# Patient Record
Sex: Female | Born: 1990 | Race: White | Hispanic: Yes | Marital: Single | Smoking: Never smoker
Health system: Southern US, Community
[De-identification: ages and names within clinical notes are randomized; demographics above are authoritative.]

## PROBLEM LIST (undated history)

## (undated) DIAGNOSIS — N83209 Unspecified ovarian cyst, unspecified side: Secondary | ICD-10-CM

## (undated) HISTORY — DX: Unspecified ovarian cyst, unspecified side: N83.209

---
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
## 2011-11-08 IMAGING — US US PELVIS COMPLETE MODIFY
1 series · 13 of 25 positions shown · non-contrast
Comparison: [HOSPITAL] abdominal radiographs 05/15/2009.

CLINICAL DATA: No abdominal / suprapubic pain.

TRANSABDOMINAL AND TRANSVAGINAL ULTRASOUND OF PELVIS
TECHNIQUE: Both transabdominal and transvaginal ultrasound
examinations of the pelvis were performed including evaluation of
the uterus, ovaries, adnexal regions, and pelvic cul-de-sac.

[Series 1: us pelvis complete modify · 0.28mm/px · 13 of 73 slices shown]
[im 1/73]
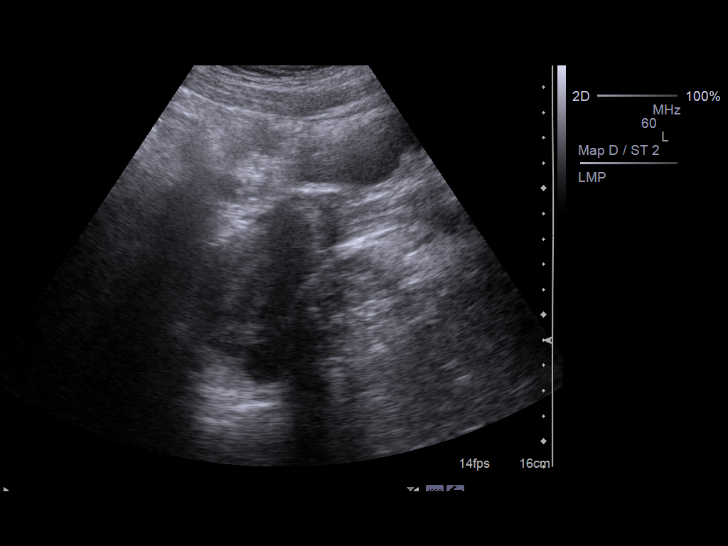
[im 7/73]
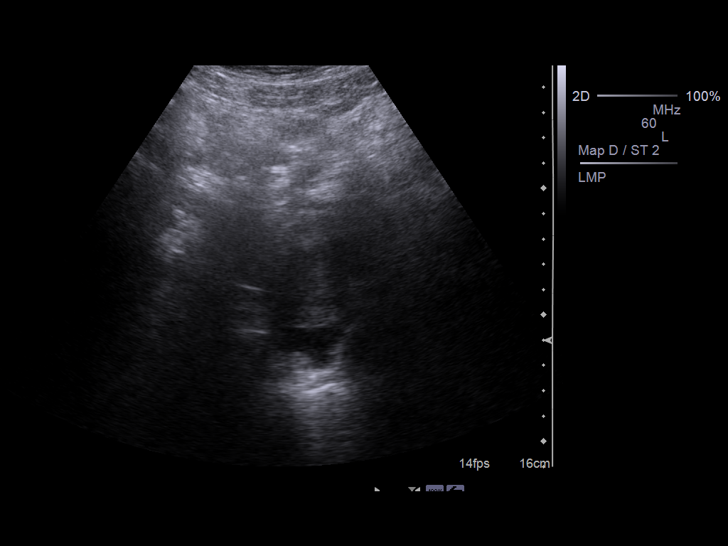
[im 13/73]
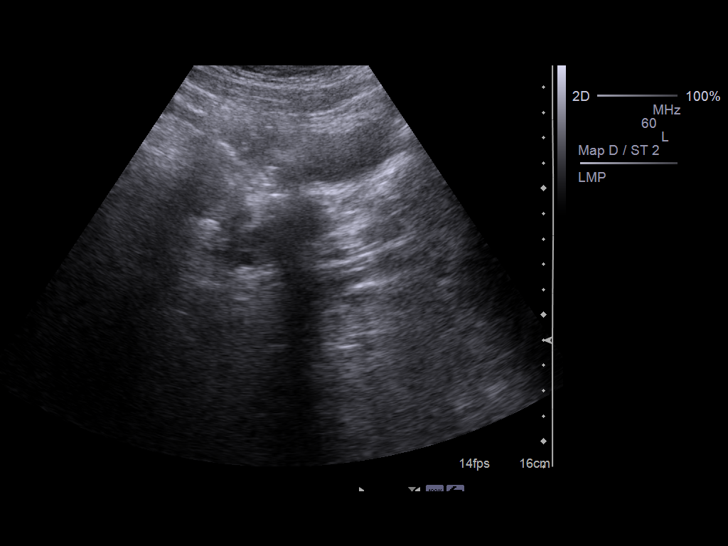
[im 19/73]
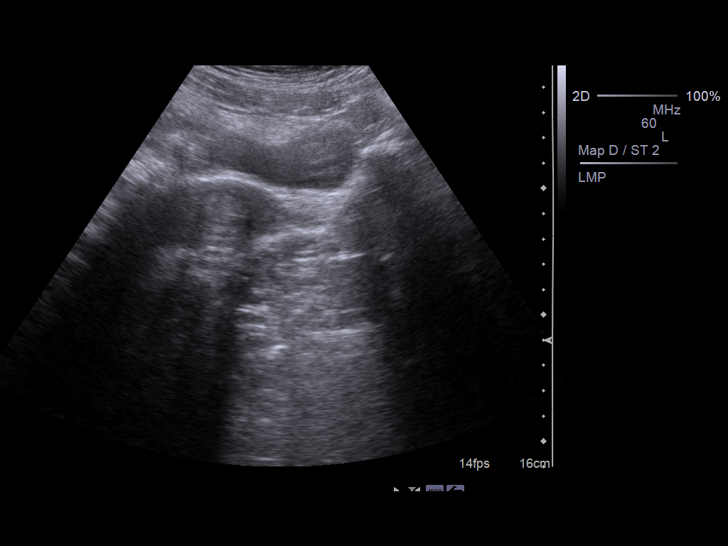
[im 25/73]
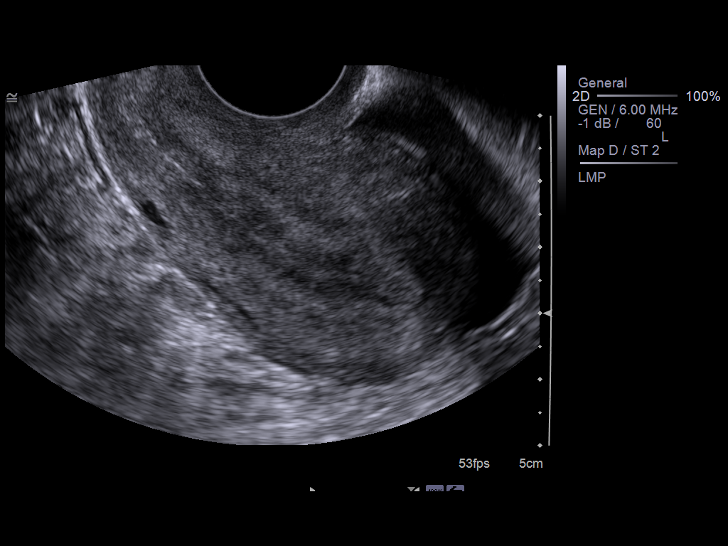
[im 31/73]
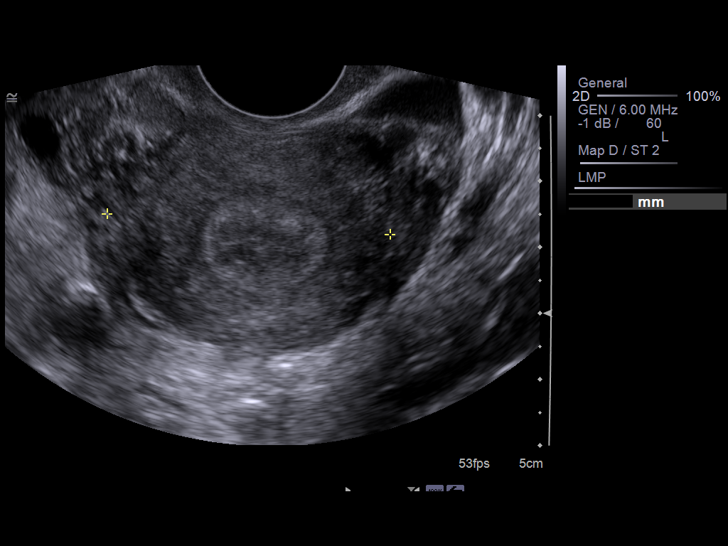
[im 37/73]
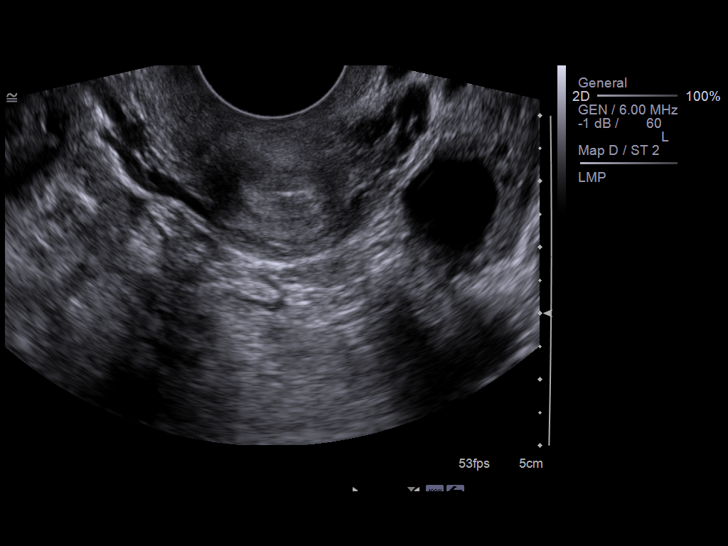
[im 43/73]
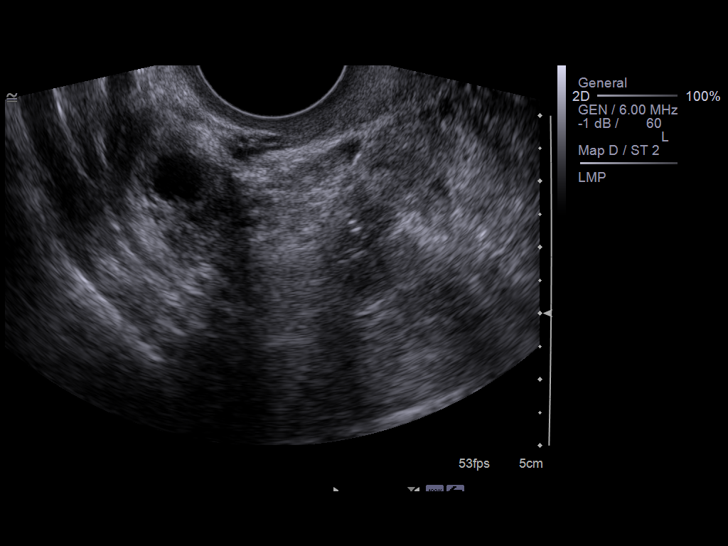
[im 49/73]
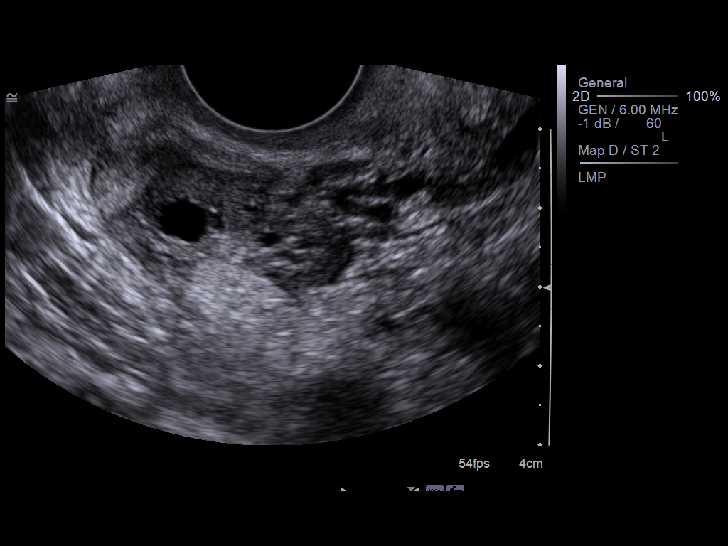
[im 55/73]
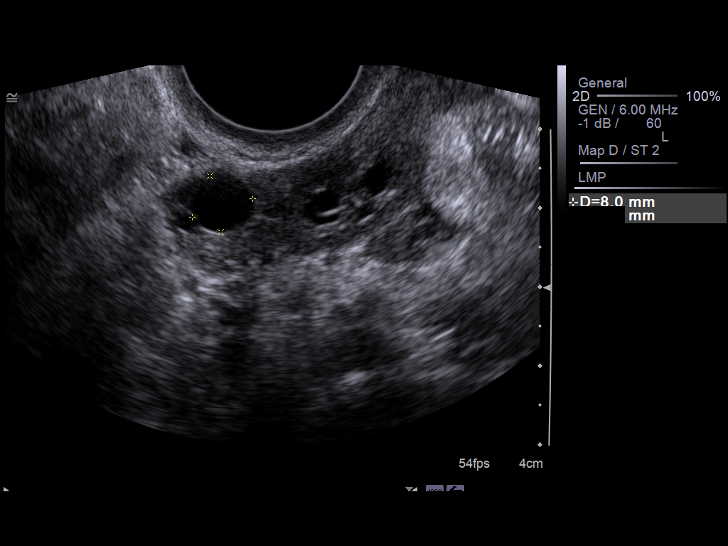
[im 61/73]
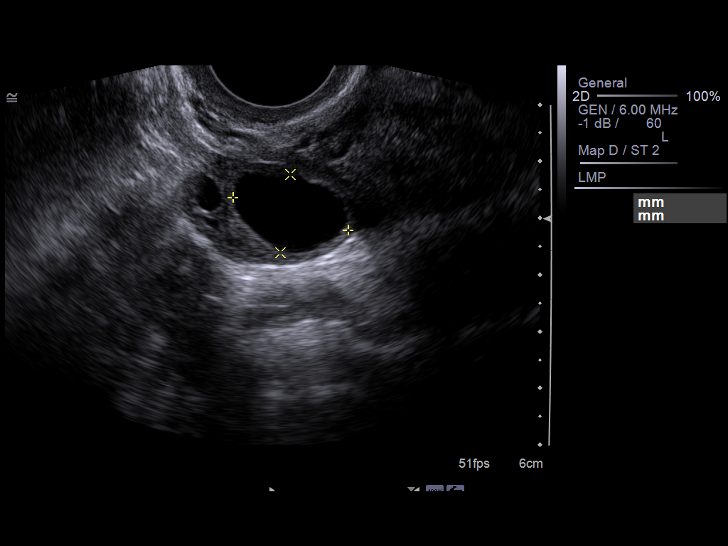
[im 67/73]
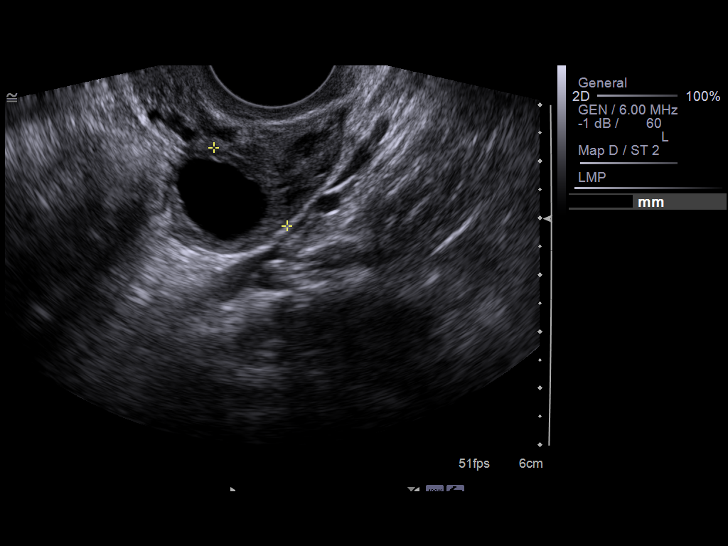
[im 73/73]
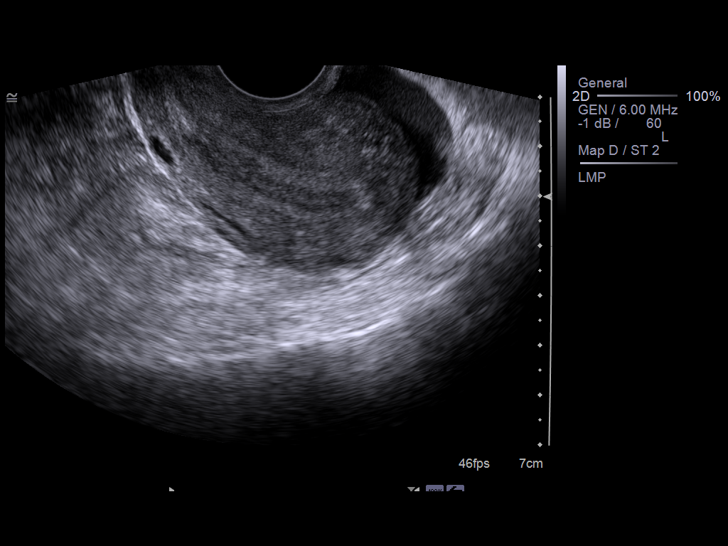

[13 of 25 positions shown; findings below may reference images not displayed]

FINDINGS: Uterus measures normal for age at 698 cm long X 0.9 cm AP X 4.3 cm
wide with no focal lesion and slight retroversion.

Endometrium appears unchanged and normal in size for age 1.2 cm
wide.

Right Ovary measures normal in size at 3.5 cm long X 1.3 cm AP X
1.6 cm wide containing a half of is (9 mm and less).

Left Ovary measures normal in size at 3.5 cm long X 2.2 cm AP X
cm wide containing simple follicular cyst measuring 2.1 cm long X
1.4 cm AP X 1.7 cm wide.

Other Findings:  Slight cul-de-sac free fluid is consistent with
ovulation and/or ruptured ovarian cyst.
IMPRESSION: 1.  Slight cul-de-sac free fluid consistent with ovulation and/or
ruptured ovarian cyst.
2.  Anatomic variant retroverted normal.
3.  Simple 2.1 cm left ovarian follicular cyst. (No further imaging
follow-up deemed necessary).
4.  Otherwise, negative.

## 2012-05-26 ENCOUNTER — Encounter: Payer: Self-pay | Admitting: Obstetrics

## 2013-05-13 ENCOUNTER — Ambulatory Visit: Payer: Self-pay | Admitting: Physician Assistant

## 2013-05-13 VITALS — BP 108/58 | HR 77 | Temp 98.8°F | Resp 18 | Ht 62.5 in | Wt 168.0 lb

## 2013-05-13 DIAGNOSIS — R110 Nausea: Secondary | ICD-10-CM

## 2013-05-13 DIAGNOSIS — Z309 Encounter for contraceptive management, unspecified: Secondary | ICD-10-CM

## 2013-05-13 LAB — POCT URINE PREGNANCY: Preg Test, Ur: NEGATIVE

## 2013-05-13 MED ORDER — NORGESTIM-ETH ESTRAD TRIPHASIC 0.18/0.215/0.25 MG-35 MCG PO TABS: 1.0000 | ORAL_TABLET | Freq: Every day | ORAL | Status: DC

## 2013-05-13 NOTE — Progress Notes (Signed)
   Subjective:    Patient ID: Melinda LeighDaisy Harper, female    DOB: 1990/01/29, 23 y.o.   MRN: [id number]  HPI 23 year old female presents for contraception management.  She has been taking Tri-Sprintec for "years" without any problems.  She has had regular periods and has no concern of pregnancy.  Is also requesting a pap test today. States she had one 4-5 years ago that was normal. Has hx of ovarian cysts but has not had any problems recently.  Also complains of slight nausea at night when she is laying in bed.  Takes her OCP's around 6:00 p.m, sometimes with food but not always.  States this has been improving and is less frequent now.  No associated vomiting or abdominal pain.  Denies vaginal discharge, abdominal pain, dysuria, urinary frequency.  She is married. 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  Patient is otherwise doing well with no other concerns today.     Review of Systems  Constitutional: Negative for fever and chills.  Gastrointestinal: Positive for nausea. Negative for vomiting, abdominal pain and diarrhea.  Genitourinary: Negative for dysuria, frequency, vaginal discharge and vaginal pain.       Objective:   Physical Exam  Constitutional: She is oriented to person, place, and time. She appears well-developed and well-nourished.  HENT:  Head: Normocephalic and atraumatic.  Right Ear: External ear normal.  Left Ear: External ear normal.  Eyes: Conjunctivae are normal.  Neck: Normal range of motion.  Cardiovascular: Normal rate, regular rhythm and normal heart sounds.   Pulmonary/Chest: Effort normal and breath sounds normal.  Abdominal: Soft. Bowel sounds are normal. There is no tenderness. There is no rebound and no guarding.  Neurological: She is alert and oriented to person, place, and time.  Psychiatric: She has a normal mood and affect. Her behavior is normal. Judgment and thought content normal.    Results for orders placed in visit on 05/13/13  POCT URINE  PREGNANCY      Result Value Ref Range   Preg Test, Ur Negative           Assessment & Plan:  Contraception management - Plan: POCT urine pregnancy  Nausea alone  Due to cost, plan to schedule [facility] - numbers and contact person provided to patient.   Refilled OCP's x 6 months. Will await pap results to refill for entire year.  Follow up here as needed.

## 2013-05-13 NOTE — Patient Instructions (Signed)
[facility] - Martie LeeSabrina [phone]

## 2013-07-29 ENCOUNTER — Ambulatory Visit (INDEPENDENT_AMBULATORY_CARE_PROVIDER_SITE_OTHER): Payer: Self-pay | Admitting: Family Medicine

## 2013-07-29 VITALS — BP 118/70 | HR 84 | Temp 98.6°F | Resp 16 | Ht 63.25 in | Wt 167.8 lb

## 2013-07-29 DIAGNOSIS — Z308 Encounter for other contraceptive management: Secondary | ICD-10-CM

## 2013-07-29 DIAGNOSIS — N3000 Acute cystitis without hematuria: Secondary | ICD-10-CM

## 2013-07-29 DIAGNOSIS — R300 Dysuria: Secondary | ICD-10-CM

## 2013-07-29 DIAGNOSIS — Z01419 Encounter for gynecological examination (general) (routine) without abnormal findings: Secondary | ICD-10-CM

## 2013-07-29 DIAGNOSIS — N898 Other specified noninflammatory disorders of vagina: Secondary | ICD-10-CM

## 2013-07-29 LAB — POCT UA - MICROSCOPIC ONLY
CASTS, UR, LPF, POC: NEGATIVE
CRYSTALS, UR, HPF, POC: NEGATIVE
MUCUS UA: NEGATIVE
YEAST UA: NEGATIVE

## 2013-07-29 LAB — POCT URINALYSIS DIPSTICK
Bilirubin, UA: NEGATIVE
GLUCOSE UA: NEGATIVE
Ketones, UA: NEGATIVE
Nitrite, UA: POSITIVE
Protein, UA: 100
Spec Grav, UA: 1.02
Urobilinogen, UA: 0.2
pH, UA: 6.5

## 2013-07-29 LAB — POCT WET PREP WITH KOH
KOH PREP POC: NEGATIVE
Trichomonas, UA: NEGATIVE
Yeast Wet Prep HPF POC: NEGATIVE

## 2013-07-29 MED ORDER — SULFAMETHOXAZOLE-TMP DS 800-160 MG PO TABS: 10.0000 | ORAL_TABLET | Freq: Two times a day (BID) | ORAL | Status: DC

## 2013-07-29 MED ORDER — NORGESTIM-ETH ESTRAD TRIPHASIC 0.18/0.215/0.25 MG-35 MCG PO TABS: 1.0000 | ORAL_TABLET | Freq: Every day | ORAL | Status: DC

## 2013-07-29 NOTE — Progress Notes (Signed)
Subjective: 23 year old lady who is here for several things. She needs a Pap smear to be given more of her birth control pills. She tried to go elsewhere but never could get him to give her an appointment. She's been having some dysuria last few days is a little bit of blood in the urine. She has had the passage of a clot of right clotted material that had a tiny bit of blood in it from her vagina. Last intercourse was over a week ago. She is married, her husband her is her only sexual partner. 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 Of back pain or fevers.  Objective: Well-developed young lady in no acute distress. Chest clear. Heart regular without murmurs. Abdomen soft and nontender. No CVA tenderness. Normal external genitalia. Vaginal mucosa unremarkable. Cervix benign. Pap was taken. There is only some mild nonspecific normal whitish discharge. Bimanual exam feels no adnexal or uterine masses. Extremities normal.   Assessment: Contraceptive management Dysuria Nonspecific vaginal discharge Urinary tract infection Pap smear   Results for orders placed in visit on 07/29/13  POCT WET PREP WITH KOH      Result Value Ref Range   Trichomonas, UA Negative     Clue Cells Wet Prep HPF POC 3-5     Epithelial Wet Prep HPF POC 6-10     Yeast Wet Prep HPF POC neg     Bacteria Wet Prep HPF POC 4+     RBC Wet Prep HPF POC 0-1     WBC Wet Prep HPF POC 2-6     KOH Prep POC Negative    POCT UA - MICROSCOPIC ONLY      Result Value Ref Range   WBC, Ur, HPF, POC 4-8     RBC, urine, microscopic tntc     Bacteria, U Microscopic 4+     Mucus, UA neg     Epithelial cells, urine per micros 0-3     Crystals, Ur, HPF, POC neg     Casts, Ur, LPF, POC neg     Yeast, UA neg    POCT URINALYSIS DIPSTICK      Result Value Ref Range   Color, UA yellow     Clarity, UA clear     Glucose, UA neg     Bilirubin, UA neg     Ketones, UA neg     Spec Grav, UA 1.020     Blood, UA moderate      pH, UA 6.5     Protein, UA 100     Urobilinogen, UA 0.2     Nitrite, UA positive     Leukocytes, UA Trace     I do not feel that the few clue cells is consistent with her having BP, so did not treat for that. Did treat for a mild urinary tract infection. Decided not to culture her urine.  Paps smear #3

## 2013-07-29 NOTE — Patient Instructions (Signed)
Drink lots of fluids.  Return if further problems with passing stuff from your vagina.  Continue current birth control pills.

## 2013-08-01 LAB — PAP IG, CT-NG, RFX HPV ASCU
CHLAMYDIA PROBE AMP: NEGATIVE
GC PROBE AMP: NEGATIVE

## 2013-12-06 ENCOUNTER — Ambulatory Visit (INDEPENDENT_AMBULATORY_CARE_PROVIDER_SITE_OTHER): Payer: Self-pay | Admitting: Internal Medicine

## 2013-12-06 VITALS — BP 118/60 | HR 69 | Temp 98.2°F | Resp 16 | Ht 62.5 in | Wt 172.2 lb

## 2013-12-06 DIAGNOSIS — R1031 Right lower quadrant pain: Secondary | ICD-10-CM

## 2013-12-06 MED ORDER — NORETHIN ACE-ETH ESTRAD-FE 1.5-30 MG-MCG PO TABS: 1.0000 | ORAL_TABLET | Freq: Every day | ORAL | Status: DC

## 2013-12-06 MED ORDER — POLYETHYLENE GLYCOL 3350 17 GM/SCOOP PO POWD: 17.0000 g | Freq: Two times a day (BID) | ORAL | Status: DC | PRN

## 2013-12-06 NOTE — Progress Notes (Signed)
   Subjective:    Patient ID: Melinda Harper, female    DOB: August 27, 1990, 23 y.o.   MRN: [id number]  HPI Complaining of right lower quadrant and mid lower quadrant discomfort intermittently since jyesterday No nausea or vomiting/no loss of appetite/no diarrhea/history of intermittent constipation for months. Most recently she has had only 2 bowel movements in the last 24 hours both producing a very small and hard stool. No bleeding. No GU symptoms. No vaginal symptoms or dyspareunia. No fever or chills.  She also recently had chest pain which was worse with deep breathing and occurred without diaphoresis palpitations nausea or shortness of breath. This extended over a few days until she discontinued her birth control pills and then the chest pain disappeared She remains asymptomatic. Last menstrual period was at the discontinuation of pills approximately 2 weeks ago. She would like another form of contraception. She was on this pill for 2 years.  Married. Healthy in general. History of ovarian cystx1  Review of Systems As noted above    Objective:   Physical Exam BP 118/60 mmHg  Pulse 69  Temp(Src) 98.2 F (36.8 C) (Oral)  Resp 16  Ht 5' 2.5" (1.588 m)  Wt 172 lb 3.2 oz (78.109 kg)  BMI 30.97 kg/m2  SpO2 100%  LMP 11/14/2013 No thyromegaly or lymphadenopathy Heart regular without murmur Lungs clear Abdomen soft nontender nondistended with only slight tenderness in the right lower quadrant and negative rebound or guarding Bowel sounds are normal         Assessment & Plan:  Impression #1 abdominal pain secondary to constipation Liquids/high-fiber diet/MiraLAX twice a day to regular stools  Impression #2 needs new contraception Start Loestrin 1.5/30 at the first day of her next period

## 2014-11-03 LAB — GLUCOSE, POCT (MANUAL RESULT ENTRY): POC Glucose: 114 mg/dl — AB (ref 70–99)

## 2014-12-06 ENCOUNTER — Ambulatory Visit (INDEPENDENT_AMBULATORY_CARE_PROVIDER_SITE_OTHER): Payer: Self-pay | Admitting: Family Medicine

## 2014-12-06 ENCOUNTER — Telehealth: Payer: Self-pay

## 2014-12-06 VITALS — BP 120/78 | HR 62 | Temp 98.1°F | Resp 16 | Ht 62.0 in | Wt 169.4 lb

## 2014-12-06 DIAGNOSIS — R079 Chest pain, unspecified: Secondary | ICD-10-CM

## 2014-12-06 DIAGNOSIS — N939 Abnormal uterine and vaginal bleeding, unspecified: Secondary | ICD-10-CM

## 2014-12-06 DIAGNOSIS — Z3041 Encounter for surveillance of contraceptive pills: Secondary | ICD-10-CM

## 2014-12-06 LAB — POCT URINE PREGNANCY: Preg Test, Ur: NEGATIVE

## 2014-12-06 MED ORDER — NORETHIN ACE-ETH ESTRAD-FE 1.5-30 MG-MCG PO TABS: 1.0000 | ORAL_TABLET | Freq: Every day | ORAL | Status: DC

## 2014-12-06 NOTE — Patient Instructions (Signed)
I refilled same contraceptive for now. Pap testing every 3 years.  Try zantac over the counter if chest pain appears to be associated with food. Return with diary of when the chest pains occur. Return to the clinic or go to the nearest emergency room if any of your symptoms worsen or new symptoms occur.  Oral Contraception Use Oral contraceptive pills (OCPs) are medicines taken to prevent pregnancy. OCPs work by preventing the ovaries from releasing eggs. The hormones in OCPs also cause the cervical mucus to thicken, preventing the sperm from entering the uterus. The hormones also cause the uterine lining to become thin, not allowing a fertilized egg to attach to the inside of the uterus. OCPs are highly effective when taken exactly as prescribed. However, OCPs do not prevent sexually transmitted diseases (STDs). Safe sex practices, such as using condoms along with an OCP, can help prevent STDs. Before taking OCPs, you may have a physical exam and Pap test. Your health care provider may also order blood tests if necessary. Your health care provider will make sure you are a good candidate for oral contraception. Discuss with your health care provider the possible side effects of the OCP you may be prescribed. When starting an OCP, it can take 2 to 3 months for the body to adjust to the changes in hormone levels in your body.  HOW TO TAKE ORAL CONTRACEPTIVE PILLS Your health care provider may advise you on how to start taking the first cycle of OCPs. Otherwise, you can:   Start on day 1 of your menstrual period. You will not need any backup contraceptive protection with this start time.   Start on the first Sunday after your menstrual period or the day you get your prescription. In these cases, you will need to use backup contraceptive protection for the first week.   Start the pill at any time of your cycle. If you take the pill within 5 days of the start of your period, you are protected against  pregnancy right away. In this case, you will not need a backup form of birth control. If you start at any other time of your menstrual cycle, you will need to use another form of birth control for 7 days. If your OCP is the type called a minipill, it will protect you from pregnancy after taking it for 2 days (48 hours). After you have started taking OCPs:   If you forget to take 1 pill, take it as soon as you remember. Take the next pill at the regular time.   If you miss 2 or more pills, call your health care provider because different pills have different instructions for missed doses. Use backup birth control until your next menstrual period starts.   If you use a 28-day pack that contains inactive pills and you miss 1 of the last 7 pills (pills with no hormones), it will not matter. Throw away the rest of the non-hormone pills and start a new pill pack.  No matter which day you start the OCP, you will always start a new pack on that same day of the week. Have an extra pack of OCPs and a backup contraceptive method available in case you miss some pills or lose your OCP pack.  HOME CARE INSTRUCTIONS   Do not smoke.   Always use a condom to protect against STDs. OCPs do not protect against STDs.   Use a calendar to mark your menstrual period days.   Read the  information and directions that came with your OCP. Talk to your health care provider if you have questions.  SEEK MEDICAL CARE IF:   You develop nausea and vomiting.   You have abnormal vaginal discharge or bleeding.   You develop a rash.   You miss your menstrual period.   You are losing your hair.   You need treatment for mood swings or depression.   You get dizzy when taking the OCP.   You develop acne from taking the OCP.   You become pregnant.  SEEK IMMEDIATE MEDICAL CARE IF:   You develop chest pain.   You develop shortness of breath.   You have an uncontrolled or severe headache.   You  develop numbness or slurred speech.   You develop visual problems.   You develop pain, redness, and swelling in the legs.    This information is not intended to replace advice given to you by your health care provider. Make sure you discuss any questions you have with your health care provider.   Document Released: 12/12/2010 Document Revised: 01/13/2014 Document Reviewed: 06/13/2012 Elsevier Interactive Patient Education 2016 Elsevier Inc.   Nonspecific Chest Pain  Chest pain can be caused by many different conditions. There is always a chance that your pain could be related to something serious, such as a heart attack or a blood clot in your lungs. Chest pain can also be caused by conditions that are not life-threatening. If you have chest pain, it is very important to follow up with your health care provider. CAUSES  Chest pain can be caused by:  Heartburn.  Pneumonia or bronchitis.  Anxiety or stress.  Inflammation around your heart (pericarditis) or lung (pleuritis or pleurisy).  A blood clot in your lung.  A collapsed lung (pneumothorax). It can develop suddenly on its own (spontaneous pneumothorax) or from trauma to the chest.  Shingles infection (varicella-zoster virus).  Heart attack.  Damage to the bones, muscles, and cartilage that make up your chest wall. This can include:  Bruised bones due to injury.  Strained muscles or cartilage due to frequent or repeated coughing or overwork.  Fracture to one or more ribs.  Sore cartilage due to inflammation (costochondritis). RISK FACTORS  Risk factors for chest pain may include:  Activities that increase your risk for trauma or injury to your chest.  Respiratory infections or conditions that cause frequent coughing.  Medical conditions or overeating that can cause heartburn.  Heart disease or family history of heart disease.  Conditions or health behaviors that increase your risk of developing a blood  clot.  Having had chicken pox (varicella zoster). SIGNS AND SYMPTOMS Chest pain can feel like:  Burning or tingling on the surface of your chest or deep in your chest.  Crushing, pressure, aching, or squeezing pain.  Dull or sharp pain that is worse when you move, cough, or take a deep breath.  Pain that is also felt in your back, neck, shoulder, or arm, or pain that spreads to any of these areas. Your chest pain may come and go, or it may stay constant. DIAGNOSIS Lab tests or other studies may be needed to find the cause of your pain. Your health care provider may have you take a test called an ambulatory ECG (electrocardiogram). An ECG records your heartbeat patterns at the time the test is performed. You may also have other tests, such as:  Transthoracic echocardiogram (TTE). During echocardiography, sound waves are used to create a picture of  all of the heart structures and to look at how blood flows through your heart.  Transesophageal echocardiogram (TEE).This is a more advanced imaging test that obtains images from inside your body. It allows your health care provider to see your heart in finer detail.  Cardiac monitoring. This allows your health care provider to monitor your heart rate and rhythm in real time.  Holter monitor. This is a portable device that records your heartbeat and can help to diagnose abnormal heartbeats. It allows your health care provider to track your heart activity for several days, if needed.  Stress tests. These can be done through exercise or by taking medicine that makes your heart beat more quickly.  Blood tests.  Imaging tests. TREATMENT  Your treatment depends on what is causing your chest pain. Treatment may include:  Medicines. These may include:  Acid blockers for heartburn.  Anti-inflammatory medicine.  Pain medicine for inflammatory conditions.  Antibiotic medicine, if an infection is present.  Medicines to dissolve blood  clots.  Medicines to treat coronary artery disease.  Supportive care for conditions that do not require medicines. This may include:  Resting.  Applying heat or cold packs to injured areas.  Limiting activities until pain decreases. HOME CARE INSTRUCTIONS  If you were prescribed an antibiotic medicine, finish it all even if you start to feel better.  Avoid any activities that bring on chest pain.  Do not use any tobacco products, including cigarettes, chewing tobacco, or electronic cigarettes. If you need help quitting, ask your health care provider.  Do not drink alcohol.  Take medicines only as directed by your health care provider.  Keep all follow-up visits as directed by your health care provider. This is important. This includes any further testing if your chest pain does not go away.  If heartburn is the cause for your chest pain, you may be told to keep your head raised (elevated) while sleeping. This reduces the chance that acid will go from your stomach into your esophagus.  Make lifestyle changes as directed by your health care provider. These may include:  Getting regular exercise. Ask your health care provider to suggest some activities that are safe for you.  Eating a heart-healthy diet. A registered dietitian can help you to learn healthy eating options.  Maintaining a healthy weight.  Managing diabetes, if necessary.  Reducing stress. SEEK MEDICAL CARE IF:  Your chest pain does not go away after treatment.  You have a rash with blisters on your chest.  You have a fever. SEEK IMMEDIATE MEDICAL CARE IF:   Your chest pain is worse.  You have an increasing cough, or you cough up blood.  You have severe abdominal pain.  You have severe weakness.  You faint.  You have chills.  You have sudden, unexplained chest discomfort.  You have sudden, unexplained discomfort in your arms, back, neck, or jaw.  You have shortness of breath at any  time.  You suddenly start to sweat, or your skin gets clammy.  You feel nauseous or you vomit.  You suddenly feel light-headed or dizzy.  Your heart begins to beat quickly, or it feels like it is skipping beats. These symptoms may represent a serious problem that is an emergency. Do not wait to see if the symptoms will go away. Get medical help right away. Call your local emergency services (911 in the [place] Do not drive yourself to the hospital.   This information is not intended to replace advice given  to you by your health care provider. Make sure you discuss any questions you have with your health care provider.   Document Released: 10/02/2004 Document Revised: 01/13/2014 Document Reviewed: 07/29/2013 Elsevier Interactive Patient Education [employer]

## 2014-12-06 NOTE — Telephone Encounter (Signed)
Pt. Called in [facility] Dr[facility] Neva SeatGreene wrote on her AVS that he refilled her birth control. There wasn't a prescription from him from today, so I spoke with Joni ReiningNicole and she went ahead and filled the prescription and I informed the Pt.

## 2014-12-06 NOTE — Progress Notes (Signed)
Pt called stating her OCP was not at pharmacy. Refilled.

## 2014-12-06 NOTE — Addendum Note (Signed)
Addended by: Dorna LeitzBUSH, NICOLE V on: 12/06/2014 06:56 PM   Modules accepted: Orders

## 2014-12-06 NOTE — Progress Notes (Signed)
Subjective:    Patient ID: Melinda Harper, female    DOB: [birth year], 24 y.o.   MRN: [id number]  HPI Melinda Harper is a 24 y.o. female  Here to refill contraceptive.  On microgestin Fe once per day past year. No breakthrough bleeding typically, but ran out 2 weeks ago. Noticed a small amount of brown discharge and lighter period last time - LMP about 11/19/14 - about 9 or 10 days early. No other side effects of meds, nonsmoker. Married for 3 years, no other sexual partners.  Normal Pap last year.   Had occasional chest pain with prior OCP, but less with this one. Rare sharp pain, approx once per week. Lasts seconds only. Notices left or right side, no known associated activity, but possibly after eating. Occasional exercise. NKI. No associated dyspnea, n/v, or diaphoresis. No calf pain or swelling.  Lost 9lbs when in [place] but gained back 6 pounds.   There are no active problems to display for this patient.  Past Medical History  Diagnosis Date  . Ovarian cyst 2000   History reviewed. No pertinent past surgical history. No Known Allergies Prior to Admission medications   Medication Sig Start Date End Date Taking? Authorizing Provider  norethindrone-ethinyl estradiol-iron (MICROGESTIN FE,GILDESS FE,LOESTRIN FE) 1.5-30 MG-MCG tablet Take 1 tablet by mouth daily. 12/06/13  Yes Tonye Pearson, MD  polyethylene glycol powder (GLYCOLAX/MIRALAX) powder Take 17 g by mouth 2 (two) times daily as needed. Until regular BMs start Patient not taking: Reported on 12/06/2014 12/06/13   Tonye Pearson, MD   Social History   Social History  . Marital Status: Single    Spouse Name: N/A  . Number of Children: N/A  . Years of Education: N/A   Occupational History  . Not on file.   Social History Main Topics  . Smoking status: Never Smoker   . Smokeless tobacco: Never Used  . Alcohol Use: Not on file  . Drug Use: Not on file  . Sexual Activity: Not on file   Other Topics Concern  . Not  on file   Social History Narrative    Review of Systems  Constitutional: Negative for fever, chills and unexpected weight change.  Respiratory: Negative for cough and shortness of breath.   Cardiovascular: Positive for chest pain. Negative for palpitations and leg swelling.  Gastrointestinal: Negative for abdominal pain.  Genitourinary: Positive for vaginal bleeding (as above. ). Negative for dysuria, urgency, hematuria, decreased urine volume and pelvic pain.       Objective:   Physical Exam  Constitutional: She is oriented to person, place, and time. She appears well-developed and well-nourished.  HENT:  Head: Normocephalic and atraumatic.  Eyes: Conjunctivae and EOM are normal. Pupils are equal, round, and reactive to light.  Neck: Carotid bruit is not present.  Cardiovascular: Normal rate, regular rhythm, normal heart sounds and intact distal pulses.   Pulmonary/Chest: Effort normal and breath sounds normal.  Chest wall nontender.   Abdominal: Soft. She exhibits no pulsatile midline mass. There is no tenderness.  Neurological: She is alert and oriented to person, place, and time.  Skin: Skin is warm and dry.  Psychiatric: She has a normal mood and affect. Her behavior is normal.  Vitals reviewed.  Filed Vitals:   12/06/14 0830  BP: 120/78  Pulse: 62  Temp: 98.1 F (36.7 C)  TempSrc: Oral  Resp: 16  Height: 5\' 2"  (1.575 m)  Weight: 169 lb 6.4 oz (76.839 kg)  SpO2: 98%  Results for orders placed or performed in visit on 12/06/14  POCT urine pregnancy  Result Value Ref Range   Preg Test, Ur Negative Negative        Assessment & Plan:  Melinda Harper is a 24 y.o. female Abnormal vaginal bleeding - Plan: POCT urine pregnancy Encounter for surveillance of contraceptive pills - Plan: POCT urine pregnancy  - restart ocp, but backup form of contraception discussed initially.  Chest pain, unspecified chest pain type  - Rare, intermittent, short lasting. No concerning  findings on history exam. Possible chest wall versus reflux/gastritis. Trial of Zantac over-the-counter, Journal of symptoms and associated activities. Follow-up to discuss further the next month or 2. Sooner if worse.  No orders of the defined types were placed in this encounter.   Patient Instructions  I refilled same contraceptive for now. Pap testing every 3 years.  Try zantac over the counter if chest pain appears to be associated with food. Return with diary of when the chest pains occur. Return to the clinic or go to the nearest emergency room if any of your symptoms worsen or new symptoms occur.  Oral Contraception Use Oral contraceptive pills (OCPs) are medicines taken to prevent pregnancy. OCPs work by preventing the ovaries from releasing eggs. The hormones in OCPs also cause the cervical mucus to thicken, preventing the sperm from entering the uterus. The hormones also cause the uterine lining to become thin, not allowing a fertilized egg to attach to the inside of the uterus. OCPs are highly effective when taken exactly as prescribed. However, OCPs do not prevent sexually transmitted diseases (STDs). Safe sex practices, such as using condoms along with an OCP, can help prevent STDs. Before taking OCPs, you may have a physical exam and Pap test. Your health care provider may also order blood tests if necessary. Your health care provider will make sure you are a good candidate for oral contraception. Discuss with your health care provider the possible side effects of the OCP you may be prescribed. When starting an OCP, it can take 2 to 3 months for the body to adjust to the changes in hormone levels in your body.  HOW TO TAKE ORAL CONTRACEPTIVE PILLS Your health care provider may advise you on how to start taking the first cycle of OCPs. Otherwise, you can:   Start on day 1 of your menstrual period. You will not need any backup contraceptive protection with this start time.   Start on the  first Sunday after your menstrual period or the day you get your prescription. In these cases, you will need to use backup contraceptive protection for the first week.   Start the pill at any time of your cycle. If you take the pill within 5 days of the start of your period, you are protected against pregnancy right away. In this case, you will not need a backup form of birth control. If you start at any other time of your menstrual cycle, you will need to use another form of birth control for 7 days. If your OCP is the type called a minipill, it will protect you from pregnancy after taking it for 2 days (48 hours). After you have started taking OCPs:   If you forget to take 1 pill, take it as soon as you remember. Take the next pill at the regular time.   If you miss 2 or more pills, call your health care provider because different pills have different instructions for missed doses. Use  backup birth control until your next menstrual period starts.   If you use a 28-day pack that contains inactive pills and you miss 1 of the last 7 pills (pills with no hormones), it will not matter. Throw away the rest of the non-hormone pills and start a new pill pack.  No matter which day you start the OCP, you will always start a new pack on that same day of the week. Have an extra pack of OCPs and a backup contraceptive method available in case you miss some pills or lose your OCP pack.  HOME CARE INSTRUCTIONS   Do not smoke.   Always use a condom to protect against STDs. OCPs do not protect against STDs.   Use a calendar to mark your menstrual period days.   Read the information and directions that came with your OCP. Talk to your health care provider if you have questions.  SEEK MEDICAL CARE IF:   You develop nausea and vomiting.   You have abnormal vaginal discharge or bleeding.   You develop a rash.   You miss your menstrual period.   You are losing your hair.   You need treatment  for mood swings or depression.   You get dizzy when taking the OCP.   You develop acne from taking the OCP.   You become pregnant.  SEEK IMMEDIATE MEDICAL CARE IF:   You develop chest pain.   You develop shortness of breath.   You have an uncontrolled or severe headache.   You develop numbness or slurred speech.   You develop visual problems.   You develop pain, redness, and swelling in the legs.    This information is not intended to replace advice given to you by your health care provider. Make sure you discuss any questions you have with your health care provider.   Document Released: 12/12/2010 Document Revised: 01/13/2014 Document Reviewed: 06/13/2012 Elsevier Interactive Patient Education 2016 Elsevier Inc.   Nonspecific Chest Pain  Chest pain can be caused by many different conditions. There is always a chance that your pain could be related to something serious, such as a heart attack or a blood clot in your lungs. Chest pain can also be caused by conditions that are not life-threatening. If you have chest pain, it is very important to follow up with your health care provider. CAUSES  Chest pain can be caused by:  Heartburn.  Pneumonia or bronchitis.  Anxiety or stress.  Inflammation around your heart (pericarditis) or lung (pleuritis or pleurisy).  A blood clot in your lung.  A collapsed lung (pneumothorax). It can develop suddenly on its own (spontaneous pneumothorax) or from trauma to the chest.  Shingles infection (varicella-zoster virus).  Heart attack.  Damage to the bones, muscles, and cartilage that make up your chest wall. This can include:  Bruised bones due to injury.  Strained muscles or cartilage due to frequent or repeated coughing or overwork.  Fracture to one or more ribs.  Sore cartilage due to inflammation (costochondritis). RISK FACTORS  Risk factors for chest pain may include:  Activities that increase your risk for  trauma or injury to your chest.  Respiratory infections or conditions that cause frequent coughing.  Medical conditions or overeating that can cause heartburn.  Heart disease or family history of heart disease.  Conditions or health behaviors that increase your risk of developing a blood clot.  Having had chicken pox (varicella zoster). SIGNS AND SYMPTOMS Chest pain can feel like:  Burning  or tingling on the surface of your chest or deep in your chest.  Crushing, pressure, aching, or squeezing pain.  Dull or sharp pain that is worse when you move, cough, or take a deep breath.  Pain that is also felt in your back, neck, shoulder, or arm, or pain that spreads to any of these areas. Your chest pain may come and go, or it may stay constant. DIAGNOSIS Lab tests or other studies may be needed to find the cause of your pain. Your health care provider may have you take a test called an ambulatory ECG (electrocardiogram). An ECG records your heartbeat patterns at the time the test is performed. You may also have other tests, such as:  Transthoracic echocardiogram (TTE). During echocardiography, sound waves are used to create a picture of all of the heart structures and to look at how blood flows through your heart.  Transesophageal echocardiogram (TEE).This is a more advanced imaging test that obtains images from inside your body. It allows your health care provider to see your heart in finer detail.  Cardiac monitoring. This allows your health care provider to monitor your heart rate and rhythm in real time.  Holter monitor. This is a portable device that records your heartbeat and can help to diagnose abnormal heartbeats. It allows your health care provider to track your heart activity for several days, if needed.  Stress tests. These can be done through exercise or by taking medicine that makes your heart beat more quickly.  Blood tests.  Imaging tests. TREATMENT  Your treatment  depends on what is causing your chest pain. Treatment may include:  Medicines. These may include:  Acid blockers for heartburn.  Anti-inflammatory medicine.  Pain medicine for inflammatory conditions.  Antibiotic medicine, if an infection is present.  Medicines to dissolve blood clots.  Medicines to treat coronary artery disease.  Supportive care for conditions that do not require medicines. This may include:  Resting.  Applying heat or cold packs to injured areas.  Limiting activities until pain decreases. HOME CARE INSTRUCTIONS  If you were prescribed an antibiotic medicine, finish it all even if you start to feel better.  Avoid any activities that bring on chest pain.  Do not use any tobacco products, including cigarettes, chewing tobacco, or electronic cigarettes. If you need help quitting, ask your health care provider.  Do not drink alcohol.  Take medicines only as directed by your health care provider.  Keep all follow-up visits as directed by your health care provider. This is important. This includes any further testing if your chest pain does not go away.  If heartburn is the cause for your chest pain, you may be told to keep your head raised (elevated) while sleeping. This reduces the chance that acid will go from your stomach into your esophagus.  Make lifestyle changes as directed by your health care provider. These may include:  Getting regular exercise. Ask your health care provider to suggest some activities that are safe for you.  Eating a heart-healthy diet. A registered dietitian can help you to learn healthy eating options.  Maintaining a healthy weight.  Managing diabetes, if necessary.  Reducing stress. SEEK MEDICAL CARE IF:  Your chest pain does not go away after treatment.  You have a rash with blisters on your chest.  You have a fever. SEEK IMMEDIATE MEDICAL CARE IF:   Your chest pain is worse.  You have an increasing cough, or you  cough up blood.  You have  severe abdominal pain.  You have severe weakness.  You faint.  You have chills.  You have sudden, unexplained chest discomfort.  You have sudden, unexplained discomfort in your arms, back, neck, or jaw.  You have shortness of breath at any time.  You suddenly start to sweat, or your skin gets clammy.  You feel nauseous or you vomit.  You suddenly feel light-headed or dizzy.  Your heart begins to beat quickly, or it feels like it is skipping beats. These symptoms may represent a serious problem that is an emergency. Do not wait to see if the symptoms will go away. Get medical help right away. Call your local emergency services (911 in the [place] Do not drive yourself to the hospital.   This information is not intended to replace advice given to you by your health care provider. Make sure you discuss any questions you have with your health care provider.   Document Released: 10/02/2004 Document Revised: 01/13/2014 Document Reviewed: 07/29/2013 Elsevier Interactive Patient Education [employer]2016 Elsevier Inc.

## 2015-05-22 ENCOUNTER — Ambulatory Visit (INDEPENDENT_AMBULATORY_CARE_PROVIDER_SITE_OTHER): Payer: Self-pay | Admitting: Internal Medicine

## 2015-05-22 ENCOUNTER — Encounter: Payer: Self-pay | Admitting: Internal Medicine

## 2015-05-22 VITALS — BP 110/64 | HR 70 | Resp 16 | Ht 62.0 in | Wt 176.0 lb

## 2015-05-22 DIAGNOSIS — Z331 Pregnant state, incidental: Secondary | ICD-10-CM

## 2015-05-22 DIAGNOSIS — Z3490 Encounter for supervision of normal pregnancy, unspecified, unspecified trimester: Secondary | ICD-10-CM

## 2015-05-22 MED ORDER — PRENATAL PLUS 27-1 MG PO TABS: 1.0000 | ORAL_TABLET | Freq: Every day | ORAL | Status: DC

## 2015-05-22 NOTE — Patient Instructions (Signed)
Get started on Prenatal vitamins today. Stay physically active daily

## 2015-05-22 NOTE — Progress Notes (Signed)
   Subjective:    Patient ID: Melinda Harper, female    DOB: 19-Aug-1990, 25 y.o.   MRN: [id number]  HPI Pt. Here to have pregnancy test.  LMP was 03/14/2015.  States was on Microgestin and in the process of moving to a different home with her husband, forgot to take intermittently.  Thinks she completely stopped taking after her LMP in March.   Is in nursing school and was not exactly planning for a child yet.  She is okay with this, however.  Meds:  None  Allergies:  NKDA  Past Medical History  Diagnosis Date  . Ovarian cyst 2000    Unknown laterality   No past surgical history on file.   Social History   Social History  . Marital Status: Single    Spouse Name: Theone MurdochLuis Lara Cortes  . Number of Children: N/A  . 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 Smoking status: Never Smoker   . Smokeless tobacco: Never Used  . Alcohol Use: Yes     Comment: None recently.  . Drug Use: No  . Sexual Activity: Yes   Other Topics Concern  . Not on file   Social History Narrative   Lives at home in remodeled trailer with husband of 3 years.   Husband is in construction/drywall   Citizen[place]   Family History  Problem Relation Age of Onset  . Heart disease Maternal Grandfather      Review of Systems     Objective:   Physical Exam NAD Lungs:  CTA CV:  RRR without murmur or rub, radial and DP pulses normal and equal Abd:  S, NT, No HSM or mass, +BS       Assessment & Plan:  1.  Pregnancy confirmed with positive urine HCG.  Letter given Information for [employer] given, though will likely be able to obtain Medicaid. PNV ordered to take daily.

## 2015-06-01 ENCOUNTER — Emergency Department (HOSPITAL_COMMUNITY)
Admission: EM | Admit: 2015-06-01 | Discharge: 2015-06-01 | Disposition: A | Discharge status: Home / Self Care | Payer: Medicaid Other | Attending: Emergency Medicine | Admitting: Emergency Medicine

## 2015-06-01 ENCOUNTER — Emergency Department (HOSPITAL_COMMUNITY): Payer: Medicaid Other

## 2015-06-01 ENCOUNTER — Encounter (HOSPITAL_COMMUNITY): Payer: Self-pay | Admitting: *Deleted

## 2015-06-01 DIAGNOSIS — Z79899 Other long term (current) drug therapy: Secondary | ICD-10-CM | POA: Diagnosis not present

## 2015-06-01 DIAGNOSIS — O9989 Other specified diseases and conditions complicating pregnancy, childbirth and the puerperium: Secondary | ICD-10-CM | POA: Insufficient documentation

## 2015-06-01 DIAGNOSIS — M545 Low back pain: Secondary | ICD-10-CM | POA: Diagnosis not present

## 2015-06-01 DIAGNOSIS — R110 Nausea: Secondary | ICD-10-CM | POA: Insufficient documentation

## 2015-06-01 DIAGNOSIS — Z3A00 Weeks of gestation of pregnancy not specified: Secondary | ICD-10-CM | POA: Diagnosis not present

## 2015-06-01 DIAGNOSIS — Z8742 Personal history of other diseases of the female genital tract: Secondary | ICD-10-CM | POA: Insufficient documentation

## 2015-06-01 DIAGNOSIS — R079 Chest pain, unspecified: Secondary | ICD-10-CM | POA: Insufficient documentation

## 2015-06-01 MED ORDER — FAMOTIDINE 20 MG PO TABS: 20.0000 mg | ORAL_TABLET | Freq: Two times a day (BID) | ORAL | Status: DC

## 2015-06-01 NOTE — ED Provider Notes (Signed)
CSN: [id number]     Arrival date & time 06/01/15  1624 History  By signing my name below, I, Melinda Harper, attest that this documentation has been prepared under the direction and in the presence of Eligh Rybacki, PA-C. Electronically Signed: Phillis Harper, ED Scribe. 06/01/2015. 5:28 PM.   Chief Complaint  Patient presents with  . Chest Pain    The history is provided by the patient. No language interpreter was used.  HPI Comments: Melinda Harper is a 25 y.o. female who presents to the Emergency Department complaining of ongoing middle chest pain that is radiating to under the left and right breast. The pain initially began over a year ago. She states that it worsened this morning when she was about to sneeze and heard a pop. She reports worsening pain with deep breathing. Pt has been seen by her PCP for the same and told to eat right and exercise. She was recently seen by her PCP and told she had a positive pregnancy test. Pt reports associated nausea for 2 weeks, has been "burping" a lot, and lower back pain. LMP was 03/14/15. She denies hx of anxiety, vomiting, diarrhea, abdominal pain, leg swelling, leg pain, or SOB.  Past Medical History  Diagnosis Date  . Ovarian cyst 2000    Unknown laterality   History reviewed. No pertinent past surgical history. Family History  Problem Relation Age of Onset  . Heart disease Maternal Grandfather    Social History  Substance Use Topics  . Smoking status: Never Smoker   . Smokeless tobacco: Never Used  . Alcohol Use: Yes     Comment: None recently.   OB History    Gravida Para Term Preterm AB TAB SAB Ectopic Multiple Living   1              Review of Systems  Respiratory: Negative for shortness of breath.   Cardiovascular: Positive for chest pain. Negative for leg swelling.  Gastrointestinal: Positive for nausea. Negative for vomiting, abdominal pain and diarrhea.  Musculoskeletal: Positive for back pain. Negative for arthralgias.    Allergies  Review of patient's allergies indicates no known allergies.  Home Medications   Prior to Admission medications   Medication Sig Start Date End Date Taking? Authorizing Provider  prenatal vitamin w/FE, FA (PRENATAL 1 + 1) 27-1 MG TABS tablet Take 1 tablet by mouth daily at 12 noon. 05/22/15   Julieanne Manson, MD   BP 121/67 mmHg  Pulse 71  Temp(Src) 98.5 F (36.9 C)  Resp 16  Ht  (1.6 m)  Wt 178 lb 3 oz (80.825 kg)  BMI 31.57 kg/m2  SpO2 98%  LMP 03/14/2015 (Exact Date) Physical Exam  Constitutional: She is oriented to person, place, and time. She appears well-developed and well-nourished.  HENT:  Head: Normocephalic and atraumatic.  Eyes: EOM are normal.  Neck: Normal range of motion. Neck supple.  Cardiovascular: Normal rate, regular rhythm and normal heart sounds.   Pulmonary/Chest: Effort normal and breath sounds normal. No respiratory distress. She has no wheezes. She has no rales. She exhibits no tenderness.  Abdominal: Soft. Bowel sounds are normal. She exhibits no distension. There is no tenderness. There is no rebound.  Musculoskeletal: Normal range of motion.  Neurological: She is alert and oriented to person, place, and time.  Skin: Skin is warm and dry.  Psychiatric: She has a normal mood and affect. Her behavior is normal.  Nursing note and vitals reviewed.   ED Course  Procedures (  including critical care time) DIAGNOSTIC STUDIES: Oxygen Saturation is 98% on RA, normal by my interpretation.    COORDINATION OF CARE: 5:27 PM-Discussed treatment plan which includes labs and x-ray with pt at bedside and pt agreed to plan.    Labs Review Labs Reviewed - No data to display  Imaging Review Dg Chest 1 View  06/01/2015  CLINICAL DATA:  Pregnant, chest pain, ongoing for 1 week EXAM: CHEST 1 VIEW COMPARISON:  None. FINDINGS: The heart size and mediastinal contours are within normal limits. Both lungs are clear. The visualized skeletal structures  are unremarkable. IMPRESSION: No active disease. Electronically Signed   By: Elige KoHetal  Patel   On: 06/01/2015 17:48   I have personally reviewed and evaluated these images and lab results as part of my medical decision-making.   EKG Interpretation None      MDM   Final diagnoses:  Chest pain, unspecified chest pain type   Patient in emergency department complaining of chest pain that she has had on and off for a year. She also reports some burping, eating spicy foods, burning sensation. She is pregnant, last menstrual cycle was 03/14/2015. She has no related to her pregnancy complaints. She has no abdominal pain, vaginal discharge or bleeding. She is not short of breath. She has normal vital signs. She is not tachypneic, tachycardic, hypoxic. EKG is unremarkable. I have very low concern for PE given symptoms have been there for over a year. She has no lower extremity swelling or calf pain. Will get chest x-ray.  6:24 PM] Chest x-ray is normal. Patient is no acute distress. I do not suspect patient has a PE, low risk for any cardiac disease. Patient apparently went to a dentist earlier today with her brother and became anxious today. Patient states that this went pain got worse. Question whether patient's pain is due to anxiety, also could be due to acid reflux or musculoskeletal chest wall pain. Will start on Pepcid. Instructed to take Tylenol for pain. She'll follow up with her doctor. Return precautions discussed.  Filed Vitals:   06/01/15 1714  BP: 121/67  Pulse: 71  Temp: 98.5 F (36.9 C)  Resp: 16  Height: 5\' 3"  (1.6 m)  Weight: 80.825 kg  SpO2: 98%     Jaynie Crumbleatyana Jhoel Stieg, PA-C 06/01/15 1828  Leta BaptistEmily Roe Nguyen, MD 06/03/15 [phone]

## 2015-06-01 NOTE — ED Notes (Signed)
Patient going to xray

## 2015-06-01 NOTE — ED Notes (Signed)
The pt is c/o mid chest pain for a long time she has been seen at her doctors about it and she has been told to eat right and exercise.  She was just seen by her doctor and she had a pos pregnancy test  No chest pain at present. Some nausea that she has had for a week.or 2.  lmp  feb

## 2015-06-01 NOTE — Discharge Instructions (Signed)
Continue prenatal vitamins. Take Pepcid as prescribed daily. Take Tylenol for pain. Avoid spicy foods. Avoid any strenuous activity. Follow-up with family doctor. Return if worsening symptoms.  Nonspecific Chest Pain  Chest pain can be caused by many different conditions. There is always a chance that your pain could be related to something serious, such as a heart attack or a blood clot in your lungs. Chest pain can also be caused by conditions that are not life-threatening. If you have chest pain, it is very important to follow up with your health care provider. CAUSES  Chest pain can be caused by:  Heartburn.  Pneumonia or bronchitis.  Anxiety or stress.  Inflammation around your heart (pericarditis) or lung (pleuritis or pleurisy).  A blood clot in your lung.  A collapsed lung (pneumothorax). It can develop suddenly on its own (spontaneous pneumothorax) or from trauma to the chest.  Shingles infection (varicella-zoster virus).  Heart attack.  Damage to the bones, muscles, and cartilage that make up your chest wall. This can include:  Bruised bones due to injury.  Strained muscles or cartilage due to frequent or repeated coughing or overwork.  Fracture to one or more ribs.  Sore cartilage due to inflammation (costochondritis). RISK FACTORS  Risk factors for chest pain may include:  Activities that increase your risk for trauma or injury to your chest.  Respiratory infections or conditions that cause frequent coughing.  Medical conditions or overeating that can cause heartburn.  Heart disease or family history of heart disease.  Conditions or health behaviors that increase your risk of developing a blood clot.  Having had chicken pox (varicella zoster). SIGNS AND SYMPTOMS Chest pain can feel like:  Burning or tingling on the surface of your chest or deep in your chest.  Crushing, pressure, aching, or squeezing pain.  Dull or sharp pain that is worse when you  move, cough, or take a deep breath.  Pain that is also felt in your back, neck, shoulder, or arm, or pain that spreads to any of these areas. Your chest pain may come and go, or it may stay constant. DIAGNOSIS Lab tests or other studies may be needed to find the cause of your pain. Your health care provider may have you take a test called an ambulatory ECG (electrocardiogram). An ECG records your heartbeat patterns at the time the test is performed. You may also have other tests, such as:  Transthoracic echocardiogram (TTE). During echocardiography, sound waves are used to create a picture of all of the heart structures and to look at how blood flows through your heart.  Transesophageal echocardiogram (TEE).This is a more advanced imaging test that obtains images from inside your body. It allows your health care provider to see your heart in finer detail.  Cardiac monitoring. This allows your health care provider to monitor your heart rate and rhythm in real time.  Holter monitor. This is a portable device that records your heartbeat and can help to diagnose abnormal heartbeats. It allows your health care provider to track your heart activity for several days, if needed.  Stress tests. These can be done through exercise or by taking medicine that makes your heart beat more quickly.  Blood tests.  Imaging tests. TREATMENT  Your treatment depends on what is causing your chest pain. Treatment may include:  Medicines. These may include:  Acid blockers for heartburn.  Anti-inflammatory medicine.  Pain medicine for inflammatory conditions.  Antibiotic medicine, if an infection is present.  Medicines to  dissolve blood clots.  Medicines to treat coronary artery disease.  Supportive care for conditions that do not require medicines. This may include:  Resting.  Applying heat or cold packs to injured areas.  Limiting activities until pain decreases. HOME CARE INSTRUCTIONS  If you  were prescribed an antibiotic medicine, finish it all even if you start to feel better.  Avoid any activities that bring on chest pain.  Do not use any tobacco products, including cigarettes, chewing tobacco, or electronic cigarettes. If you need help quitting, ask your health care provider.  Do not drink alcohol.  Take medicines only as directed by your health care provider.  Keep all follow-up visits as directed by your health care provider. This is important. This includes any further testing if your chest pain does not go away.  If heartburn is the cause for your chest pain, you may be told to keep your head raised (elevated) while sleeping. This reduces the chance that acid will go from your stomach into your esophagus.  Make lifestyle changes as directed by your health care provider. These may include:  Getting regular exercise. Ask your health care provider to suggest some activities that are safe for you.  Eating a heart-healthy diet. A registered dietitian can help you to learn healthy eating options.  Maintaining a healthy weight.  Managing diabetes, if necessary.  Reducing stress. SEEK MEDICAL CARE IF:  Your chest pain does not go away after treatment.  You have a rash with blisters on your chest.  You have a fever. SEEK IMMEDIATE MEDICAL CARE IF:   Your chest pain is worse.  You have an increasing cough, or you cough up blood.  You have severe abdominal pain.  You have severe weakness.  You faint.  You have chills.  You have sudden, unexplained chest discomfort.  You have sudden, unexplained discomfort in your arms, back, neck, or jaw.  You have shortness of breath at any time.  You suddenly start to sweat, or your skin gets clammy.  You feel nauseous or you vomit.  You suddenly feel light-headed or dizzy.  Your heart begins to beat quickly, or it feels like it is skipping beats. These symptoms may represent a serious problem that is an  emergency. Do not wait to see if the symptoms will go away. Get medical help right away. Call your local emergency services (911 in the [place] Do not drive yourself to the hospital.   This information is not intended to replace advice given to you by your health care provider. Make sure you discuss any questions you have with your health care provider.   Document Released: 10/02/2004 Document Revised: 01/13/2014 Document Reviewed: 07/29/2013 Elsevier Interactive Patient Education [employer]

## 2015-06-01 NOTE — ED Notes (Signed)
Pt verbalized understanding of d/c instructions and has no further questions. Pt stable and NAD. Pt to take pepcid and follow up with Dr Delrae AlfredMulberry. Pt ambulatory.

## 2015-07-02 ENCOUNTER — Encounter: Payer: Self-pay | Admitting: Family Medicine

## 2016-08-04 ENCOUNTER — Encounter: Payer: Self-pay | Admitting: Internal Medicine

## 2016-08-04 ENCOUNTER — Ambulatory Visit (INDEPENDENT_AMBULATORY_CARE_PROVIDER_SITE_OTHER): Payer: Self-pay | Admitting: Internal Medicine

## 2016-08-04 VITALS — BP 124/70 | HR 72 | Resp 12 | Ht 62.0 in | Wt 180.0 lb

## 2016-08-04 DIAGNOSIS — Z020 Encounter for examination for admission to educational institution: Secondary | ICD-10-CM

## 2016-08-04 NOTE — Patient Instructions (Signed)
Drink a glass of water before every meal Drink 6-8 glasses of water daily Eat three meals daily Eat a protein and healthy fat with every meal (eggs,fish, chicken, turkey and limit red meats) Eat 5 servings of vegetables daily, mix the colors Eat 2 servings of fruit daily with skin, if skin is edible Use smaller plates Put food/utensils down as you chew and swallow each bite Eat at a table with friends/family at least once daily, no TV Do not eat in front of the TV 

## 2016-08-04 NOTE — Progress Notes (Signed)
Subjective:    Patient ID: Melinda Harper, female    DOB: 1990-12-18, 26 y.o.   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  Has completed eye exam already.  GU exam performed already this year and normal.    No complaints  Current Meds  Medication Sig  . levonorgestrel (MIRENA, 52 MG,) 20 MCG/24HR IUD Mirena 20 mcg/24 hr (5 years) intrauterine device  Take 1 device by intrauterine route.  . Multiple Vitamins-Minerals (MULTIVITAMIN ADULT PO) Take by mouth. 1 daily    No Known Allergies   Past Medical History:  Diagnosis Date  . Ovarian cyst 2000   Unknown laterality    History reviewed. No pertinent surgical history.   Family History  Problem Relation Age of Onset  . Heart disease Maternal Grandfather    Social History   Social History  . Marital status: Single    Spouse name: Theone MurdochLuis Lara Cortes  . Number of children: 1  . Years of education: 3613   Occupational History  . 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 Smoking status: Never Smoker  . Smokeless tobacco: Never Used  . Alcohol use Yes     Comment: rare  . Drug use: No  . Sexual activity: Yes    Birth control/ protection: IUD     Comment: mirena   Other Topics Concern  . Not on file   Social History Narrative   Lives at home in remodeled trailer with husband and little girl   Husband is in Customer service managerconstruction/drywall   Citizen[place]       Review of Systems     Objective:   Physical Exam  Constitutional: She is oriented to person, place, and time. She appears well-developed and well-nourished.  HENT:  Head: Normocephalic and atraumatic.  Right Ear: Hearing, tympanic membrane, external ear and ear canal normal.  Left Ear: Hearing, tympanic membrane, external ear and ear canal normal.  Nose: Nose normal.  Mouth/Throat: Uvula is midline, oropharynx is clear and moist and  mucous membranes are normal.  Eyes: Pupils are equal, round, and reactive to light. Conjunctivae and EOM are normal.  Neck: Normal range of motion and full passive range of motion without pain. Neck supple. No thyromegaly present.  Cardiovascular: Normal rate, regular rhythm, S1 normal and S2 normal.  Exam reveals no S3, no S4 and no friction rub.   No murmur heard. Carotid, radial, femoral, DP and PT pulses normal and equal.   Pulmonary/Chest: Effort normal and breath sounds normal. Right breast exhibits no inverted nipple, no mass, no nipple discharge, no skin change and no tenderness. Left breast exhibits no inverted nipple, no mass, no nipple discharge, no skin change and no tenderness.  Abdominal: Soft. Bowel sounds are normal. She exhibits no mass. There is no hepatosplenomegaly. There is no tenderness. No hernia.  Genitourinary:  Genitourinary Comments: Deferred  Musculoskeletal: Normal range of motion.  Full ROM back without discomfort  Lymphadenopathy:       Head (right side): No submental and no submandibular adenopathy present.       Head (left side): No submental and no submandibular adenopathy present.    She has no cervical adenopathy.    She has no axillary adenopathy.       Right: No inguinal and no supraclavicular adenopathy present.       Left: No inguinal and no  supraclavicular adenopathy present.  Neurological: She is alert and oriented to person, place, and time. She has normal strength and normal reflexes. No cranial nerve deficit or sensory deficit. Coordination and gait normal.  Skin: Skin is warm and dry. No rash noted.  Psychiatric: She has a normal mood and affect. Her speech is normal and behavior is normal. Judgment and thought content normal. Cognition and memory are normal.          Assessment & Plan:  1.  Normal school entry exam States had TB testing done through [facility], negative thus far. Tdap up to date. No paperwork requesting immunization  status. Form filled out.  2.  Obesity:  Encouraged lifestyle changes with diet and physical activity.

## 2017-11-24 IMAGING — DX DG CHEST 1V
1 series · 1 of 1 positions shown · non-contrast
Comparison: None.

CLINICAL DATA: Pregnant, chest pain, ongoing for 1 week

EXAM:
CHEST 1 VIEW

[w chest pa]
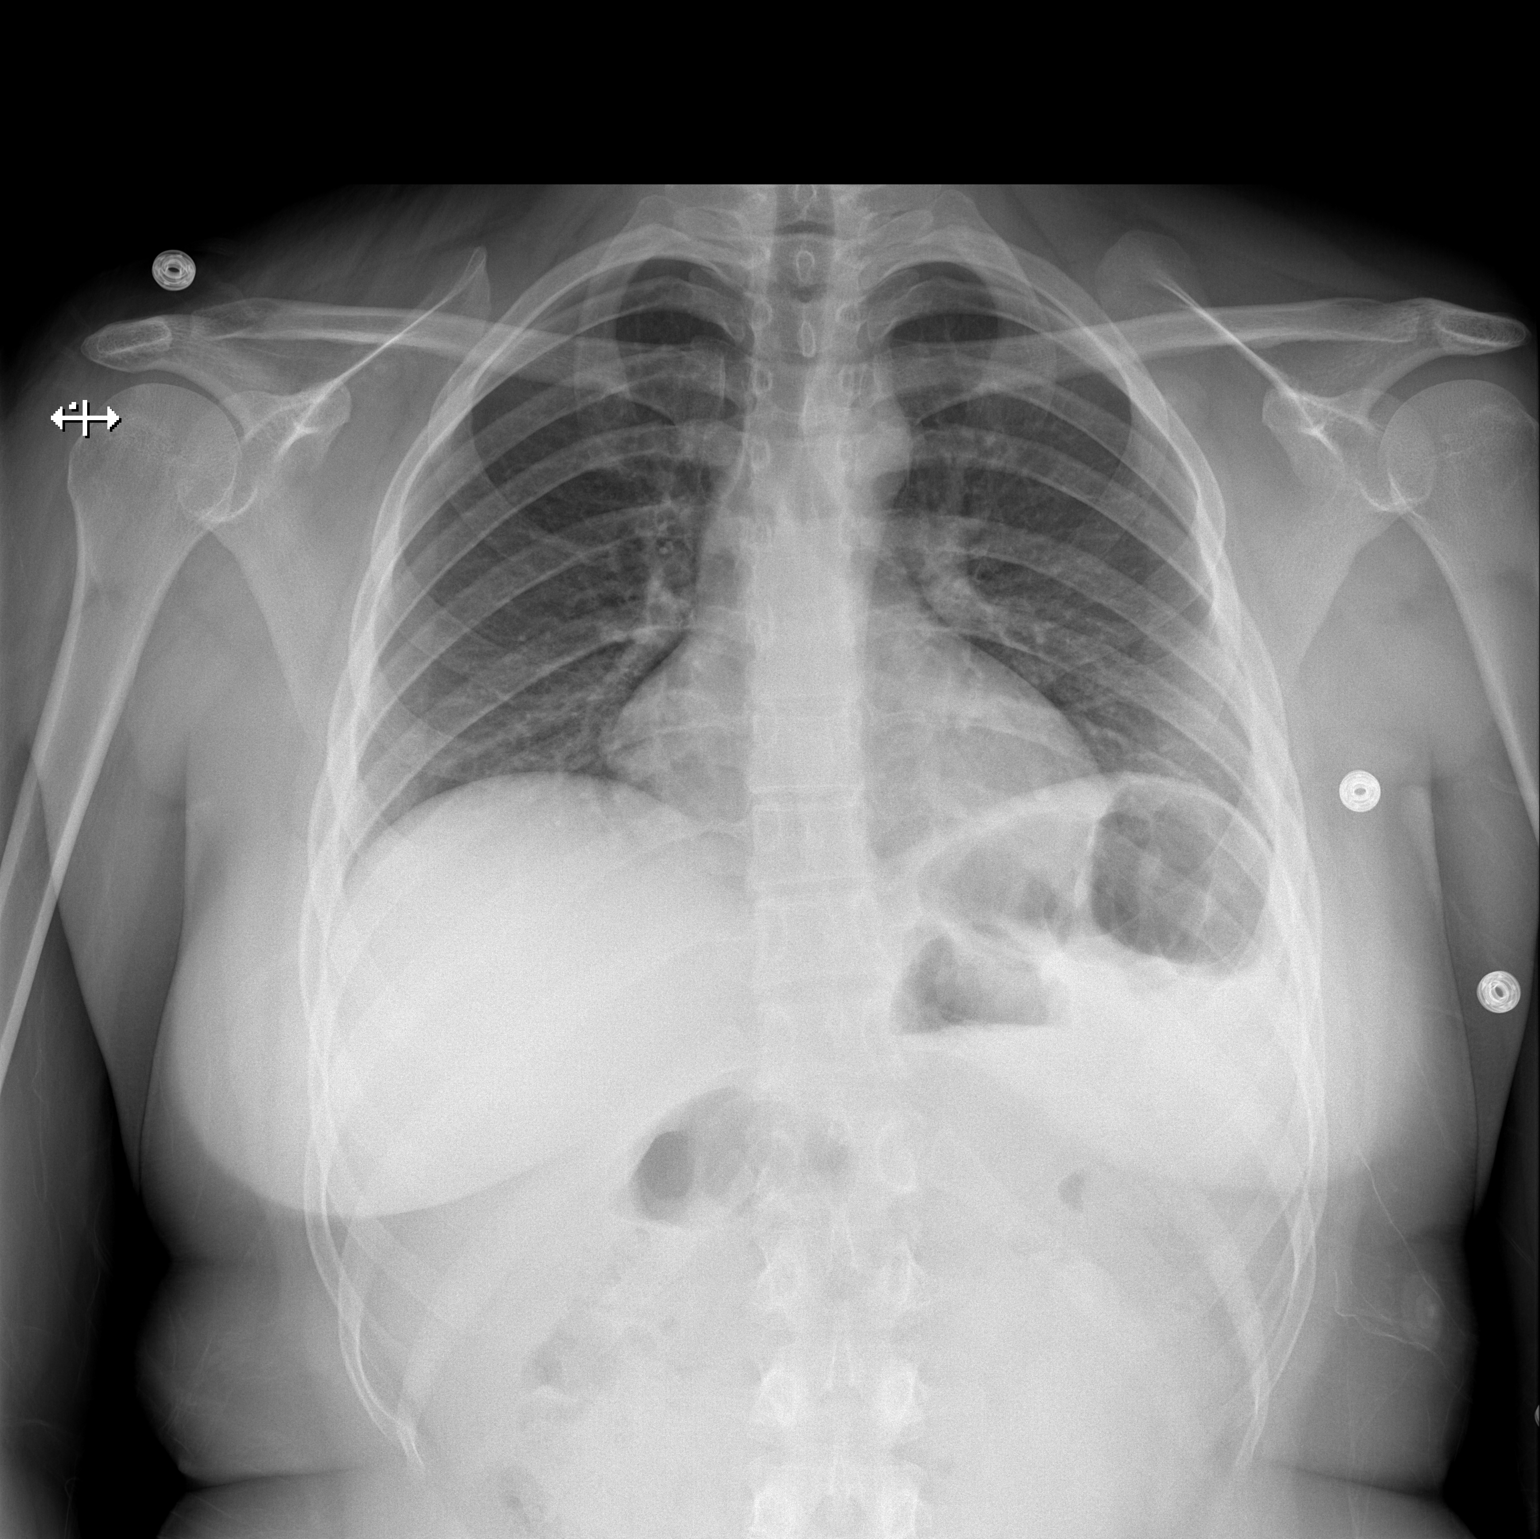

[1 of 1 positions shown; findings below may reference images not displayed]

FINDINGS: The heart size and mediastinal contours are within normal limits.
Both lungs are clear. The visualized skeletal structures are
unremarkable.
IMPRESSION: No active disease.

## 2019-09-13 ENCOUNTER — Other Ambulatory Visit: Payer: Self-pay

## 2019-09-13 DIAGNOSIS — Z20822 Contact with and (suspected) exposure to covid-19: Secondary | ICD-10-CM

## 2019-09-14 LAB — NOVEL CORONAVIRUS, NAA: SARS-CoV-2, NAA: NOT DETECTED

## 2019-09-14 LAB — SARS-COV-2, NAA 2 DAY TAT

## 2021-03-29 ENCOUNTER — Ambulatory Visit (HOSPITAL_COMMUNITY): Payer: Self-pay

## 2021-05-02 ENCOUNTER — Ambulatory Visit (INDEPENDENT_AMBULATORY_CARE_PROVIDER_SITE_OTHER): Payer: Self-pay | Admitting: Internal Medicine

## 2021-05-02 ENCOUNTER — Encounter: Payer: Self-pay | Admitting: Internal Medicine

## 2021-05-02 VITALS — BP 110/62 | HR 64 | Resp 16 | Ht 62.75 in | Wt 159.0 lb

## 2021-05-02 DIAGNOSIS — G5602 Carpal tunnel syndrome, left upper limb: Secondary | ICD-10-CM

## 2021-05-02 DIAGNOSIS — G5622 Lesion of ulnar nerve, left upper limb: Secondary | ICD-10-CM

## 2021-05-02 DIAGNOSIS — E559 Vitamin D deficiency, unspecified: Secondary | ICD-10-CM

## 2021-05-02 MED ORDER — IBUPROFEN 200 MG PO TABS: ORAL_TABLET | ORAL | 0 refills | Status: AC

## 2021-05-02 NOTE — Progress Notes (Signed)
Subjective:    Patient ID: Melinda Harper, female   DOB: [birth year], 31 y.o.   MRN: [id number]   HPI  Reestablishing after 5 years.   Left arm and foot pain:  Started after delivering her first child, Melinda Harper, in 2018.  Thinks was about 4-5 months after her birth and perhaps soon after Mirena IUD inserted.  Started in her left arm as burning, numbness, and tingling.  States generally has the discomfort mainly on the ulnar nerve distribution, including the fingers and forearm or separately the median nerve distribution and radial forearm, but not both.   Her symptoms resolved at one point, but then recurred after her second child, Melinda Harper on 01/18/2020, only 2 days later and then also started having tingling in her left toes and calf as well.  Cannot say how long after [name]'s birth the foot symptoms occurred Rarely, has tingling in all of her toes and sometimes in posterior calf/gastroc area.  Never really up in her thigh.   Feels she has some sharp pain around the left shoulder blade and maybe high chest at times with the left arm symptoms.   Her symptoms come and go--can go a week without symptoms and then have hand symptoms every day.  Generally, when has the hand symptoms she is able to change positions and shake it out and resolves quickly.   With the foot and leg symptoms--generally only occurs with sitting or standing.  Occurs maybe once monthly.  Can be with or without the arm and hand symptoms.  States she breastfed Melinda Harper for a very short time, almost always bottle fed and as she is right handed, would tend to hold her in her left arm, leaning elbow against arm of sofa.  Same for Melinda Harper as well, though breast fed for 3 months.  Has been seen by Dr Keene Breath at [facility].  Had normal nerve conduction study 02/05/21  and Xray of thoracic and lumbar spine that were normal..  Vitamin D 25 level low at 18 on 12/12/20.  She has been taking D3, she believes 5000 IU daily since.  Has not had  that rechecked.       Current Meds  Medication Sig   Cholecalciferol (VITAMIN D3 MAXIMUM STRENGTH PO) Take by mouth.   levonorgestrel (MIRENA) 20 MCG/24HR IUD Mirena 20 mcg/24 hr (5 years) intrauterine device  Take 1 device by intrauterine route.   Magnesium 100 MG CAPS Take by mouth. Unsure of dosage   No Known Allergies  Past Medical History:  Diagnosis Date   Ovarian cyst 2000   Unknown laterality   No past surgical history on file.  Family History  Problem Relation Age of Onset   Heart disease Maternal Grandfather    Family Status  Relation Name Status   MGF  Deceased   Mother  Alive       63 yo   Father  Alive       36 yo--minimal contact   Brother  Alive       67 yo   MGM  Alive   [place]   PGF  Alive   Brother  Alive       32 yo   Brother Maternal half Alive   Sister Paternal half 4   Daughter Nurse, adult, age 6y   Social History   Socioeconomic History   Marital status: Single    Spouse name: Hewitt Blade   Number of children: 1   Years of  education: 13   Highest education level: Not on file  Occupational History   Occupation: student    Comment: 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    Comment: mirena  Other Topics Concern   Not on file  Social History Narrative   Lives at home in remodeled trailer with husband and little girl   Husband is in Data processing manager   Citizen--born in [place], [place]   Social Determinants of Health   Financial Resource Strain: Not on [employer] Insecurity: Not on file  Transportation Needs: Not on file  Physical Activity: Not on file  Stress: Not on file  Social Connections: Not on file  Intimate Partner Violence: Not on file       Review of Systems    Objective:   BP 110/62 (BP Location: Right Arm, Patient Position: Sitting, Cuff Size: Normal)   Pulse 64   Resp 16   Ht 5' 2.75" (1.594 m)   Wt 159 lb (72.1 kg)   Breastfeeding No   BMI 28.39 kg/m   Physical Exam NAD HEENT:  PERRL, EOMI, TMs pearly gray, throat without injection. Neck:  Supple, No adenopathy, no thyromegaly Chest:  CTA CV:   RRR with normal S1 and S2, No S3, S4 or murmur.  Carotid, radial and DP pulses normal and equal. Abd:  S, NT, No HSM or mass, + BS LE:  No edema. Neuro:  A & O x 3, CN II-XII grossly intact.  Motor 5/5 and DTRs 2+/4 throughout.  Gait normal.  + Tinels over ulnar nerve at medial elbow.  + Tinels over median nerve at volar left wrist with + Phalens, left wrist   Assessment & Plan    Left ulnar neuropathy:  encouraged her to find elbow pad.  Pay attention to how she is sitting/lying and avoid compression at elbow--particularly when nursing or bottle feeding child.  2.  Left Carpal tunnel syndrome:  Cock up splint at night and during day if can function while wearing.  Ibuprofen twice daily for 14 days as needed, then stop.  3. Vitamin D deficiency:  recheck level today

## 2021-05-03 LAB — VITAMIN D 25 HYDROXY (VIT D DEFICIENCY, FRACTURES): Vit D, 25-Hydroxy: 35.4 ng/mL (ref 30.0–100.0)

## 2022-02-26 DIAGNOSIS — G5622 Lesion of ulnar nerve, left upper limb: Secondary | ICD-10-CM | POA: Insufficient documentation

## 2022-02-26 DIAGNOSIS — G5602 Carpal tunnel syndrome, left upper limb: Secondary | ICD-10-CM | POA: Insufficient documentation

## 2022-02-26 DIAGNOSIS — E559 Vitamin D deficiency, unspecified: Secondary | ICD-10-CM | POA: Insufficient documentation

## 2022-07-04 ENCOUNTER — Ambulatory Visit
Admission: RE | Admit: 2022-07-04 | Discharge: 2022-07-04 | Disposition: A | Discharge status: Home / Self Care | Payer: Medicaid Other | Source: Ambulatory Visit | Attending: Nurse Practitioner | Admitting: Nurse Practitioner

## 2022-07-04 VITALS — BP 112/67 | HR 97 | Temp 97.9°F | Resp 17

## 2022-07-04 DIAGNOSIS — R051 Acute cough: Secondary | ICD-10-CM | POA: Diagnosis present

## 2022-07-04 DIAGNOSIS — R432 Parageusia: Secondary | ICD-10-CM | POA: Insufficient documentation

## 2022-07-04 DIAGNOSIS — B309 Viral conjunctivitis, unspecified: Secondary | ICD-10-CM | POA: Diagnosis not present

## 2022-07-04 DIAGNOSIS — U071 COVID-19: Secondary | ICD-10-CM | POA: Diagnosis not present

## 2022-07-04 DIAGNOSIS — J069 Acute upper respiratory infection, unspecified: Secondary | ICD-10-CM | POA: Insufficient documentation

## 2022-07-04 NOTE — ED Triage Notes (Signed)
Pt presents with c/o colds sxs x 3d. Pt states she started having diarrhea 3 days ago and then had a runny nose and cough.  C/o rt eye irritation. Denies fever, n/v and sore throat.

## 2022-07-04 NOTE — ED Provider Notes (Signed)
[facility]    CSN: [id number] Arrival date & time: 07/04/22  1102      History   Chief Complaint Chief Complaint  Patient presents with   Eye Problem    Entered by patient   Nasal Congestion   Cough    HPI Melinda Harper is a 32 y.o. female  presents for evaluation of URI symptoms for 3 days. Patient reports associated symptoms of cough, congestion, loss of taste and smell, diarrhea, right eye irritation. Denies nausea/vomiting, sore throat, body aches, ear pain, shortness of breath. Patient does not have a hx of asthma or smoking.  She is 8 months pregnant.  Pt has taken Mucinex DM OTC for symptoms. Pt has no other concerns at this time.    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 No pertinent surgical history.  OB History     Gravida  2   Para      Term      Preterm      AB      Living         SAB      IAB      Ectopic      Multiple      Live Births               Home Medications    Prior to Admission medications   Medication Sig Start Date End Date Taking? Authorizing Provider  Cholecalciferol (VITAMIN D3 MAXIMUM STRENGTH PO) Take by mouth.    [provider]  ibuprofen (ADVIL) 200 MG tablet 3 tabs by mouth twice daily with meals for 2 weeks then only as needed. 05/02/21   Julieanne Manson, MD  levonorgestrel (MIRENA) 20 MCG/24HR IUD Mirena 20 mcg/24 hr (5 years) intrauterine device  Take 1 device by intrauterine route. 03/13/16   [provider]  Magnesium 100 MG CAPS Take by mouth. Unsure of dosage    [provider]  Multiple Vitamins-Minerals (MULTIVITAMIN ADULT PO) Take by mouth. 1  daily Patient not taking: Reported on 05/02/2021    [provider]    Family History Family History  Problem Relation Age of Onset   Heart disease Maternal Grandfather     Social History Social History   Tobacco Use   Smoking status: Never   Smokeless tobacco: Never  Vaping Use   Vaping Use: Never used  Substance Use Topics   Alcohol use: Yes    Comment: rare   Drug use: No     Allergies   Patient has no known allergies.   Review of Systems Review of Systems  HENT:  Positive for congestion.   Eyes:  Positive for redness.  Respiratory:  Positive for cough.      Physical Exam Triage Vital Signs ED Triage Vitals  Enc Vitals Group     BP 07/04/22 1131 112/67     Pulse Rate 07/04/22 1131 97     Resp 07/04/22 1131 17     Temp 07/04/22 1131 97.9 F (36.6 C)     Temp Source 07/04/22 1131 Oral     SpO2 07/04/22 1131 97 %  Weight --      Height --      Head Circumference --      Peak Flow --      Pain Score 07/04/22 1129 1     Pain Loc --      Pain Edu? --      Excl. in GC? --    No data found.  Updated Vital Signs BP 112/67 (BP Location: Right Arm)   Pulse 97   Temp 97.9 F (36.6 C) (Oral)   Resp 17   SpO2 97%   Visual Acuity Right Eye Distance:   Left Eye Distance:   Bilateral Distance:    Right Eye Near:   Left Eye Near:    Bilateral Near:     Physical Exam Vitals and nursing note reviewed.  Constitutional:      General: She is not in acute distress.    Appearance: She is well-developed. She is not ill-appearing.  HENT:     Head: Normocephalic and atraumatic.     Right Ear: Tympanic membrane and ear canal normal.     Left Ear: Tympanic membrane and ear canal normal.     Nose: Congestion present.     Mouth/Throat:     Mouth: Mucous membranes are moist.     Pharynx: Oropharynx is clear. Uvula midline. No oropharyngeal exudate or posterior oropharyngeal erythema.     Tonsils: No tonsillar exudate or tonsillar abscesses.   Eyes:     General: Lids are normal.        Right eye: No foreign body, discharge or hordeolum.     Conjunctiva/sclera: Conjunctivae normal.     Right eye: Right conjunctiva is not injected. No chemosis, exudate or hemorrhage.    Pupils: Pupils are equal, round, and reactive to light.  Cardiovascular:     Rate and Rhythm: Normal rate and regular rhythm.     Heart sounds: Normal heart sounds.  Pulmonary:     Effort: Pulmonary effort is normal.     Breath sounds: Normal breath sounds.  Musculoskeletal:     Cervical back: Normal range of motion and neck supple.  Lymphadenopathy:     Cervical: No cervical adenopathy.  Skin:    General: Skin is warm and dry.  Neurological:     General: No focal deficit present.     Mental Status: She is alert and oriented to person, place, and time.  Psychiatric:        Mood and Affect: Mood normal.        Behavior: Behavior normal.      UC Treatments / Results  Labs (all labs ordered are listed, but only abnormal results are displayed) Labs Reviewed  SARS CORONAVIRUS 2 (TAT 6-24 HRS)    EKG   Radiology No results found.  Procedures Procedures (including critical care time)  Medications Ordered in UC Medications - No data to display  Initial Impression / Assessment and Plan / UC Course  I have reviewed the triage vital signs and the nursing notes.  Pertinent labs & imaging results that were available during my care of the patient were reviewed by me and considered in my medical decision making (see chart for details).     Reviewed exam and symptoms with patient.  No red flags.  Discussed exam consistent with viral upper respiratory infection/viral conjunctivitis.  COVID PCR and will contact if positive.  Discussed symptomatic treatment.   Follow-up with PCP if symptoms or not improving.  ER precautions reviewed and patient  verbalized understanding. Final Clinical Impressions(s) / UC Diagnoses   Final diagnoses:  Acute cough   Loss of taste  Viral upper respiratory illness  Viral conjunctivitis     Discharge Instructions      Your symptoms and exam are consistent for a viral illness.  The clinic will contact you with results of the COVID test if positive.  Please treat your symptoms with over the counter cough medication (confirmed with pharmacist if safe during pregnancy), tylenol, humidifier, and rest. Viral illnesses can last 7-14 days. Please follow up with your PCP if your symptoms are not improving. Please go to the ER for any worsening symptoms. This includes but is not limited to fever you can not control with tylenol or ibuprofen, you are not able to stay hydrated, you have shortness of breath or chest pain.  Thank you for choosing [facility] for your healthcare needs. I hope you feel better soon!    ED Prescriptions   None    PDMP not reviewed this encounter.   Radford Pax, NP 07/04/22 1157

## 2022-07-04 NOTE — Discharge Instructions (Signed)
Your symptoms and exam are consistent for a viral illness.  The clinic will contact you with results of the COVID test if positive.  Please treat your symptoms with over the counter cough medication (confirmed with pharmacist if safe during pregnancy), tylenol, humidifier, and rest. Viral illnesses can last 7-14 days. Please follow up with your PCP if your symptoms are not improving. Please go to the ER for any worsening symptoms. This includes but is not limited to fever you can not control with tylenol or ibuprofen, you are not able to stay hydrated, you have shortness of breath or chest pain.  Thank you for choosing [facility] for your healthcare needs. I hope you feel better soon!

## 2022-07-05 LAB — SARS CORONAVIRUS 2 (TAT 6-24 HRS): SARS Coronavirus 2: POSITIVE — AB

## 2022-12-20 ENCOUNTER — Ambulatory Visit: Payer: Self-pay

## 2023-03-10 ENCOUNTER — Ambulatory Visit: Payer: Self-pay
